# Patient Record
Sex: Male | Born: 1940 | Race: White | Marital: Married | Smoking: Never smoker
Health system: Northeastern US, Academic
[De-identification: ages and names within clinical notes are randomized; demographics above are authoritative.]

## PROBLEM LIST (undated history)

## (undated) DIAGNOSIS — M1990 Unspecified osteoarthritis, unspecified site: Secondary | ICD-10-CM

## (undated) DIAGNOSIS — E119 Type 2 diabetes mellitus without complications: Secondary | ICD-10-CM

## (undated) DIAGNOSIS — I10 Essential (primary) hypertension: Secondary | ICD-10-CM

## (undated) HISTORY — PX: HIP FRACTURE SURGERY: SHX118

## (undated) HISTORY — DX: Type 2 diabetes mellitus without complications: E11.9

## (undated) HISTORY — PX: HIP SURGERY: SHX245

## (undated) HISTORY — PX: HERNIA REPAIR: SHX51

## (undated) HISTORY — DX: Unspecified osteoarthritis, unspecified site: M19.90

## (undated) HISTORY — DX: Essential (primary) hypertension: I10

## (undated) HISTORY — PX: CATARACT REMOVAL WITH IMPLANT: SHX586

---

## 2017-01-01 ENCOUNTER — Other Ambulatory Visit: Payer: Self-pay | Admitting: Orthopedic Surgery

## 2017-01-01 DIAGNOSIS — M25562 Pain in left knee: Secondary | ICD-10-CM

## 2017-01-05 ENCOUNTER — Ambulatory Visit: Payer: Medicare (Managed Care) | Admitting: Sports Medicine

## 2017-01-05 ENCOUNTER — Ambulatory Visit
Admission: RE | Admit: 2017-01-05 | Discharge: 2017-01-05 | Disposition: A | Discharge status: Home / Self Care | Payer: Medicare (Managed Care) | Source: Ambulatory Visit | Attending: Orthopedic Surgery | Admitting: Orthopedic Surgery

## 2017-01-05 ENCOUNTER — Encounter: Payer: Self-pay | Admitting: Sports Medicine

## 2017-01-05 VITALS — BP 151/72 | HR 77 | Ht 68.5 in | Wt 162.0 lb

## 2017-01-05 DIAGNOSIS — M25462 Effusion, left knee: Secondary | ICD-10-CM

## 2017-01-05 DIAGNOSIS — M1712 Unilateral primary osteoarthritis, left knee: Secondary | ICD-10-CM | POA: Insufficient documentation

## 2017-01-05 DIAGNOSIS — M25562 Pain in left knee: Secondary | ICD-10-CM | POA: Insufficient documentation

## 2017-01-05 DIAGNOSIS — M170 Bilateral primary osteoarthritis of knee: Secondary | ICD-10-CM | POA: Insufficient documentation

## 2017-01-05 NOTE — Progress Notes (Signed)
[phone]

## 2017-01-05 NOTE — Progress Notes (Signed)
[name], Adam TERRY MR #:  [id number]   CSN:  [id number] DOB:  Mar 08, 1941   DICTATED BY:  Lanae BoastMichael Cristen Murcia, MD DATE OF VISIT:  01/05/2017     A 76 year old who presents with ongoing left knee symptoms, worse since November.  Had some swelling.  Has radiographically confirmed osteoarthritis.  He wanted to discuss the option of viscosupplementation.  This was reviewed with him today.  My PA spent time going through the options for management and the treatment algorithm for knee osteoarthritis.  He is thin, has good strength.     His exam today shows no warmth, erythema.  No effusion.  Gentle motion is well tolerated.  Non-antalgic gait.  I spent time reviewing with him treatment options, including potential viscosupplementation which he would like to pursue.  A referral was made to the nonoperative primary care sports medicine team to have formal discussion, and whether he wants to move forward he will let them know.             ______________________________  Lanae BoastMichael Regenia Erck, MD    MM/MODL  DD:  01/05/2017 11:45:50  DT:  01/05/2017 16:18:03  Job #:  [id number]    cc:

## 2017-01-05 NOTE — Progress Notes (Signed)
Adam Duran:   Adam, Duran  MR #:  [id number]   CSN:  [id number] DOB:  [birth year]   DICTATED BY:  Raechel AcheMeghan Lissow, PA DATE OF VISIT:  01/05/2017     CHIEF COMPLAINT:  Left knee pain.    INTERVAL HISTORY:  Patient presents here today with atraumatic left knee pain in the medial compartment.  No mechanical symptoms.  He states that he started feeling some symptoms in November and then again, around Christmas time.  Toward the springtime, there was a little bit of swelling that has since resolved.  He has well-preserved motion.  He is playing golf.  He has tried Tylenol, which does help alleviate his pain.  He was told to stay away from anti-inflammatories, although he is not sure why.  He is a controlled diabetic with his last hemoglobin A1c of 6.8.     His past medical history, medications, past surgeries, allergies, social history, family history, review of systems are per the musculoskeletal questionnaire.    PHYSICAL EXAMINATION:  Shows a cooperative gentleman.  No warmth, erythema does have crepitation.  Well-preserved motion.  No catch with circumduction.  He is ligamentously stable.  Neurovascularly intact.    IMAGING:  X-rays were taken showing a medial compartment osteoarthritis.    DIAGNOSIS:  Left knee osteoarthritis.    PLAN:  Patient educated on the findings.  We talked about cortisone injection; however, he is not keen on this as it could affect his sugar levels.  Instead he would like to go forth with viscosupplementation.  He was educated that this is not offered by Dr. Santiago BurMaloney's office; however, he will be referred to our nonoperative group for this.  He understands he will need an appointment for authorization with subsequent appointment for injections he does understand.  Questions otherwise invited and answered.       Dictated By:  Raechel AcheMeghan Lissow, PA      ______________________________  Lanae BoastMichael Maloney, MD    ML/MODL  DD:  01/05/2017 11:43:39  DT:  01/05/2017 12:47:43  Job #:   [id number]    cc:

## 2017-01-09 ENCOUNTER — Encounter: Payer: Self-pay | Admitting: Physical Medicine and Rehabilitation

## 2017-01-09 ENCOUNTER — Ambulatory Visit: Payer: Medicare (Managed Care) | Attending: Orthopedic Surgery | Admitting: Physical Medicine and Rehabilitation

## 2017-01-09 VITALS — BP 129/69 | Ht 68.5 in | Wt 162.0 lb

## 2017-01-09 DIAGNOSIS — M1712 Unilateral primary osteoarthritis, left knee: Secondary | ICD-10-CM

## 2017-01-09 MED ORDER — LIDOCAINE HCL 1 % IJ SOLN *I*
2.0000 mL | Freq: Once | INTRAMUSCULAR | Status: AC | PRN
Start: 2017-01-09 — End: 2017-01-09
  Administered 2017-01-09: 15:00:00 2 mL via INTRA_ARTICULAR

## 2017-01-09 MED ORDER — LIDOCAINE HCL 1 % IJ SOLN *I*
1.0000 mL | Freq: Once | INTRAMUSCULAR | Status: AC | PRN
Start: 2017-01-09 — End: 2017-01-09
  Administered 2017-01-09: 15:00:00 1 mL via INTRA_ARTICULAR

## 2017-01-09 MED ORDER — METHYLPREDNISOLONE ACETATE 40 MG/ML IJ SUSP *I*
80.0000 mg | Freq: Once | INTRAMUSCULAR | Status: AC | PRN
Start: 2017-01-09 — End: 2017-01-09
  Administered 2017-01-09: 15:00:00 80 mg via INTRA_ARTICULAR

## 2017-01-09 MED ORDER — ETHYL CHLORIDE EX AERO (MULTIPLE PATIENTS) *I*
1.0000 | INHALATION_SPRAY | Freq: Once | CUTANEOUS | Status: AC | PRN
Start: 2017-01-09 — End: 2017-01-09
  Administered 2017-01-09: 1 via TOPICAL

## 2017-01-09 NOTE — Patient Instructions (Signed)
If corticosteroid injection not helpful in 2 weeks, call my secretary:    Call Marylene LandAngela [phone] to schedule any of the following injections.  You can also call your insurance and provide them the following codes to determine your cost.    Prior authorization, billing and scheduling can take up to 2-4 weeks.      Monovisc x1  [phone]([id number] + [phone] Ultrasound Fee)   *Need to go thru Pre-Verification Billing Department first*- Cannot be given same day        Orthovisc x3  [phone]([phone])  Some insurances require prior authorization so cannot be given same day, other insurances do allow same day injection

## 2017-01-09 NOTE — Progress Notes (Signed)
Chief complaint:  Knee Pain    History of Present Illness:  This is a 76 y.o. male who presents with left knee pain.  Pain started around November/December 2017.  No injuries.  The patient was referred by Dr Elease Hashimoto.    The pain is located at the anterior medial knee.  Pain is made worse with walking and stairs.    Reports minimal swelling now.  Did have more swelling back in February.  No locking.  No instability.  Does use a brace when golfing.  Reports some occasional popping/clicking.    Plays golf and uses caution to avoid painful triggers.    Specific functional goals of viscosupplementation:  -walk further distances on unsteady ground  -be able to do stairs easier     Treatment History:    Physical therapy:   Yes []  No [x]  Helpful []  Somewhat Helpful []  Not helpful []     Exercise: golf, yard work, walking  Yes [x]  No []  Helpful []  Somewhat Helpful [x]  Not helpful []     Activity modification:   Yes [x]  No []  Helpful []  Somewhat Helpful [x]  Not helpful []     Acetaminophen:   Yes [x]  No []  Helpful []  Somewhat Helpful [x]  Not helpful []     NSAIDs: unable to do concern about side effects at his age   Yes []  No [x]  Helpful []  Somewhat Helpful []  Not helpful []     Corticosteroid injections:   Yes []  No [x]  Helpful []  Somewhat Helpful []  Not helpful []     Viscosupplementation:   Yes []  No [x]  Helpful []  Somewhat Helpful []  Not helpful []     Surgery:   Yes []  No [x]  Helpful []  Somewhat Helpful []  Not helpful []     Past Medical History:  Past Medical History:   Diagnosis Date    Arthritis     Diabetes mellitus, type 2     Hypertension        Past Surgical History:  Past Surgical History:   Procedure Laterality Date    CATARACT REMOVAL WITH IMPLANT      HERNIA REPAIR      HIP FRACTURE SURGERY      HIP SURGERY         Social History:  Social History     Social History    Marital status: Married     Spouse name: N/A    Number of children: N/A    Years of education: N/A     Social History Main Topics    Smoking  status: Never Smoker    Smokeless tobacco: Never Used    Alcohol use None    Drug use: None    Sexual activity: Not Asked     Other Topics Concern    None     Social History Narrative       Family History:  History reviewed. No pertinent family history.    Review of Systems:  A complete 12 point review is negative except for the following:  Knee pain   Kidney stones    Current Medications:  Current Outpatient Prescriptions on File Prior to Visit   Medication Sig Dispense Refill    METFORMIN HCL PO Take by mouth      atorvastatin (LIPITOR) 10 MG tablet Take 10 mg by mouth daily      lisinopril (PRINIVIL,ZESTRIL) 40 MG tablet Take 40 mg by mouth daily      verapamil (VERELAN) 360 MG 24 hr capsule Take 360 mg by mouth nightly  hydrochlorothiazide (MICROZIDE) 12.5 MG capsule Take 12.5 mg by mouth every morning       No current facility-administered medications on file prior to visit.        Allergies:  No Known Allergies (drug, envir, food or latex)    Physical Exam:  Pain    01/09/17 1408   PainSc:   4   PainLoc: Knee     Blood pressure 129/69, height 1.74 m (5' 8.5"), weight 73.5 kg (162 lb).  Body mass index is 24.27 kg/(m^2).  Taken by patient care tech    General: NAD , pleasant and cooperative  HEENT: MMM, no scleral icterus  CVS: good perfusion   Pulm: normal work of breathing   Skin: no rashes   Pysch: normal affect    MSK/neuro:   Knees appear symmetrical without any lesions, erythema or deformity.  No warmth.   +small left sided effusion.   Left knee:  + crepitus with passive range of motion.  no pain to palpation around borders of patella. + medial joint line tenderness.  no lateral joint line tenderness.  no quad tendon tenderness.  no patellar tendon tenderness.  Range of motion 0-115. Neurovascularly intact.   No ligamentous laxity with negative lachmans test, anterior drawer, posterior drawer.  Stable to valgus and varus stress. No pain with circumduction maneuvers.  Full strength in  bilateral lower extremities.  Gait steady and independent.    Assessment:  This is a 76 y.o. male who presents with left knee pain.  -Advanced left knee osteoarthritis worst at medial compartment    Plan:  Imaging reviewed.    I discussed the following treatment options:   Activity modifications discussed and recommended.   Discussed weight loss or maintenance of proper weight through diet and exercise.  Ice/heat to knee.  Medication options discussed and recommended - encouraged tylenol and NSAIDs prn as able.  Discussed injections including corticosteroid injections, orthovisc and monovisc injections.    Performed corticosteroid injection to the left knee.  See procedure note  Continue with existing conservative treatment program.   Questions solicited and answered.   Patient requested to call my office for prior authorization of orthovisc or monovisc if corticosteroid injection not helpful.    Thank you for allowing us to take part in this patient's care and contact us if you have any questions.    Terance HartJENNIFER Machel Violante, MD

## 2017-01-09 NOTE — Procedures (Signed)
Large Joint Aspiration/Injection Procedure    Date/Time: 01/09/2017 2:42 PM  Consent given by: patient  Site marked: site marked  Timeout: Immediately prior to procedure a time out was called to verify the correct patient, procedure, equipment, support staff and site/side marked as required     Procedure Details    Location: knee - L knee intra - articular  Preparation: The site was prepped using the usual aseptic technique.  Anesthetics administered: 2 mL lidocaine 1 %; 1 mL lidocaine 1 %; 1 spray ethyl chloride  Intra-Articular Steroids administered: 80 mg methylPREDNISolone acetate 40 MG/ML  Dressing:  A dry, sterile dressing was applied.  Patient tolerance: patient tolerated the procedure well with no immediate complications

## 2017-05-22 LAB — UNMAPPED LAB RESULTS
Hematocrit (HT): 44.3 % (ref 40.0–52.0)
Hemoglobin (HGB) (HT): 14.6 g/dL (ref 13.0–17.5)
MCHC (HT): 33 g/dL (ref 32.0–36.0)
MCV (HT): 87.2 FL (ref 81.0–99.0)
Mean Corpuscular Hemoglobin (MCH) (HT): 28.7 pg (ref 26.0–34.0)
Platelets (HT): 221 10 3/uL (ref 140–400)
RBC (HT): 5.08 10 6/uL (ref 4.20–5.90)
RDW (HT): 12.2 % (ref 11.5–15.0)
WBC (HT): 4.8 10 3/uL (ref 4.0–10.8)

## 2017-05-23 ENCOUNTER — Ambulatory Visit
Payer: Medicare (Managed Care) | Attending: Physical Medicine and Rehabilitation | Admitting: Physical Medicine and Rehabilitation

## 2017-05-23 ENCOUNTER — Encounter: Payer: Self-pay | Admitting: Physical Medicine and Rehabilitation

## 2017-05-23 VITALS — BP 136/71 | HR 77 | Ht 68.5 in | Wt 162.0 lb

## 2017-05-23 DIAGNOSIS — M170 Bilateral primary osteoarthritis of knee: Secondary | ICD-10-CM

## 2017-05-23 MED ORDER — METHYLPREDNISOLONE ACETATE 40 MG/ML IJ SUSP *I*
80.0000 mg | Freq: Once | INTRAMUSCULAR | Status: AC | PRN
Start: 2017-05-23 — End: 2017-05-23
  Administered 2017-05-23: 80 mg via INTRA_ARTICULAR

## 2017-05-23 MED ORDER — LIDOCAINE HCL 1 % IJ SOLN *I*
1.0000 mL | Freq: Once | INTRAMUSCULAR | Status: AC | PRN
Start: 2017-05-23 — End: 2017-05-23
  Administered 2017-05-23: 1 mL via INTRA_ARTICULAR

## 2017-05-23 MED ORDER — LIDOCAINE HCL 1 % IJ SOLN *I*
2.0000 mL | Freq: Once | INTRAMUSCULAR | Status: AC | PRN
Start: 2017-05-23 — End: 2017-05-23
  Administered 2017-05-23: 13:00:00 2 mL via INTRA_ARTICULAR

## 2017-05-23 MED ORDER — ETHYL CHLORIDE EX AERO (MULTIPLE PATIENTS) *I*
1.0000 | INHALATION_SPRAY | Freq: Once | CUTANEOUS | Status: AC | PRN
Start: 2017-05-23 — End: 2017-05-23
  Administered 2017-05-23: 1 via TOPICAL

## 2017-05-23 NOTE — Progress Notes (Signed)
Chief complaint:  Knee Pain    History of Present Illness:  This is a 76 y.o. male who presents with bilateral knee pain.  Pain started around November/December 2017.  No injuries.  The patient was referred by Dr Elease Hashimoto.    Here today for follow-up of left knee pain but also now for a new problem: right knee pain.    Reports he had right knee pain back in November 2017 when left knee pain started but was not as significant until recently.  The pain is located at the medial anterior knees bilaterally.  No swelling.  No locking.  No instability.  Worse with walking/golf.    Reports excellent benefit after corticosteroid injection done in the spring.  Pain just returned in the left knee.     Heading to [place] for the winter in 2 weeks. Returns in April.       Treatment History:    Physical therapy:   Yes  No  Helpful  Somewhat Helpful  Not helpful     Exercise: golf, yard work, walking  Yes  No  Helpful  Somewhat Helpful  Not helpful     Activity modification:   Yes  No  Helpful  Somewhat Helpful  Not helpful     Acetaminophen:   Yes  No  Helpful  Somewhat Helpful  Not helpful     NSAIDs: unable to do concern about side effects at his age   Yes  No  Helpful  Somewhat Helpful  Not helpful     Corticosteroid injections:  Left knee  Yes  No  Helpful  Somewhat Helpful  Not helpful     Viscosupplementation:   Yes  No  Helpful  Somewhat Helpful  Not helpful     Surgery:   Yes  No  Helpful  Somewhat Helpful  Not helpful     Past Medical History:  Past Medical History:   Diagnosis Date    Arthritis     Diabetes mellitus, type 2     Hypertension        Past Surgical History:  Past Surgical History:   Procedure Laterality Date    CATARACT REMOVAL WITH IMPLANT      HERNIA REPAIR      HIP FRACTURE SURGERY      HIP SURGERY         Social History:  Social History     Social History    Marital status: Married     Spouse name: N/A     Number of children: N/A    Years of education: N/A     Social History Main Topics    Smoking status: Never Smoker    Smokeless tobacco: Never Used    Alcohol use None    Drug use: None    Sexual activity: Not Asked     Other Topics Concern    None     Social History Narrative       Family History:  History reviewed. No pertinent family history.    Review of Systems:  Knee pain       Current Medications:  Current Outpatient Prescriptions on File Prior to Visit   Medication Sig Dispense Refill    METFORMIN HCL PO Take by mouth      atorvastatin (LIPITOR) 10 MG tablet Take 10 mg by mouth daily      lisinopril (PRINIVIL,ZESTRIL) 40 MG tablet Take 40 mg by mouth daily      verapamil (VERELAN) 360  MG 24 hr capsule Take 360 mg by mouth nightly      hydrochlorothiazide (MICROZIDE) 12.5 MG capsule Take 12.5 mg by mouth every morning       No current facility-administered medications on file prior to visit.        Allergies:  No Known Allergies (drug, envir, food or latex)    Physical Exam:  Pain    05/23/17 1244   PainSc:   3   PainLoc: Knee     Blood pressure 136/71, pulse 77, height 1.74 m (5' 8.5"), weight 73.5 kg (162 lb).  Body mass index is 24.27 kg/(m^2).  Taken by patient care tech    General: NAD , pleasant and cooperative  HEENT: MMM, no scleral icterus  CVS: good perfusion   Pulm: normal work of breathing   Skin: no rashes   Pysch: normal affect    MSK/neuro:   Knees appear symmetrical without any lesions, erythema or deformity.  No warmth.   No effusion.   Right knee:  + crepitus with passive range of motion.  no pain to palpation around borders of patella. no medial joint line tenderness.  no lateral joint line tenderness.  no quad tendon tenderness.  no patellar tendon tenderness.  Range of motion 5-120. Neurovascularly intact.   No ligamentous laxity with negative lachmans test, anterior drawer, posterior drawer.  Stable to valgus and varus stress. No pain with circumduction maneuvers.  Gait steady  and independent.    Assessment:  This is a 76 y.o. male who presents with bilateral knee pain.  -Moderate bilateral knee osteoarthritis worst at medial compartments    Plan:  Imaging reviewed.    I discussed the following treatment options:   Activity modifications discussed and recommended.   Discussed weight loss or maintenance of proper weight through diet and exercise.  Ice/heat to knee.  Discussed injections including corticosteroid injections.  Performed corticosteroid injection to the bilateral knees.  See procedure note  Patient requested to follow-up prn    Thank you for allowing [place] to take part in this patient's care and contact us if you have any questions.    Terance Hart, MD

## 2017-05-23 NOTE — Procedures (Signed)
Large Joint Aspiration/Injection Procedure    Date/Time: 05/23/2017 1:01 PM  Consent given by: patient  Site marked: site marked  Timeout: Immediately prior to procedure a time out was called to verify the correct patient, procedure, equipment, support staff and site/side marked as required     Procedure Details    Location: knee - R knee intra - articular  Preparation: The site was prepped using the usual aseptic technique.  Anesthetics administered: 1 spray ethyl chloride; 2 mL lidocaine hcl 1 %; 1 mL lidocaine hcl 1 %  Intra-Articular Steroids administered: 80 mg methylPREDNISolone acetate 40 MG/ML  Dressing:  A dry, sterile dressing was applied.  Patient tolerance: patient tolerated the procedure well with no immediate complications

## 2017-05-23 NOTE — Procedures (Signed)
Large Joint Aspiration/Injection Procedure    Date/Time: 05/23/2017 1:02 PM  Consent given by: patient  Site marked: site marked  Timeout: Immediately prior to procedure a time out was called to verify the correct patient, procedure, equipment, support staff and site/side marked as required     Procedure Details    Location: knee - L knee intra - articular  Preparation: The site was prepped using the usual aseptic technique.  Anesthetics administered: 1 spray ethyl chloride; 2 mL lidocaine hcl 1 %; 1 mL lidocaine hcl 1 %  Intra-Articular Steroids administered: 80 mg methylPREDNISolone acetate 40 MG/ML  Dressing:  A dry, sterile dressing was applied.  Patient tolerance: patient tolerated the procedure well with no immediate complications

## 2017-12-18 ENCOUNTER — Ambulatory Visit
Payer: Medicare (Managed Care) | Attending: Physical Medicine and Rehabilitation | Admitting: Physical Medicine and Rehabilitation

## 2017-12-18 ENCOUNTER — Encounter: Payer: Self-pay | Admitting: Physical Medicine and Rehabilitation

## 2017-12-18 VITALS — BP 153/71 | HR 65 | Ht 68.5 in | Wt 162.0 lb

## 2017-12-18 DIAGNOSIS — M170 Bilateral primary osteoarthritis of knee: Secondary | ICD-10-CM

## 2017-12-18 MED ORDER — ETHYL CHLORIDE EX AERO (MULTIPLE PATIENTS) *I*
1.0000 | INHALATION_SPRAY | Freq: Once | CUTANEOUS | Status: AC | PRN
Start: 2017-12-18 — End: 2017-12-18
  Administered 2017-12-18: 1 via TOPICAL

## 2017-12-18 MED ORDER — LIDOCAINE HCL 1 % IJ SOLN *I*
2.0000 mL | Freq: Once | INTRAMUSCULAR | Status: AC | PRN
Start: 2017-12-18 — End: 2017-12-18
  Administered 2017-12-18: 2 mL via INTRA_ARTICULAR

## 2017-12-18 MED ORDER — LIDOCAINE HCL 1 % IJ SOLN *I*
1.0000 mL | Freq: Once | INTRAMUSCULAR | Status: AC | PRN
Start: 2017-12-18 — End: 2017-12-18
  Administered 2017-12-18: 1 mL via INTRA_ARTICULAR

## 2017-12-18 MED ORDER — METHYLPREDNISOLONE ACETATE 40 MG/ML IJ SUSP *I*
80.0000 mg | Freq: Once | INTRAMUSCULAR | Status: AC | PRN
Start: 2017-12-18 — End: 2017-12-18
  Administered 2017-12-18: 80 mg via INTRA_ARTICULAR

## 2017-12-18 NOTE — Procedures (Signed)
Large Joint Aspiration/Injection Procedure    Date/Time: 12/18/2017 1:03 PM  Consent given by: patient  Site marked: site marked  Timeout: Immediately prior to procedure a time out was called to verify the correct patient, procedure, equipment, support staff and site/side marked as required     Procedure Details    Location: knee - L knee intra - articular  Preparation: The site was prepped using the usual aseptic technique.  Anesthetics administered: 1 spray ethyl chloride; 2 mL lidocaine hcl 1 %; 1 mL lidocaine hcl 1 %  Intra-Articular Steroids administered: 80 mg methylPREDNISolone acetate 40 MG/ML  Dressing:  A dry, sterile dressing was applied.  Patient tolerance: patient tolerated the procedure well with no immediate complications

## 2017-12-18 NOTE — Procedures (Signed)
Large Joint Aspiration/Injection Procedure    Date/Time: 12/18/2017 1:04 PM  Consent given by: patient  Site marked: site marked  Timeout: Immediately prior to procedure a time out was called to verify the correct patient, procedure, equipment, support staff and site/side marked as required     Procedure Details    Location: knee - R knee intra - articular  Preparation: The site was prepped using the usual aseptic technique.  Anesthetics administered: 1 spray ethyl chloride; 2 mL lidocaine hcl 1 %; 1 mL lidocaine hcl 1 %  Intra-Articular Steroids administered: 80 mg methylPREDNISolone acetate 40 MG/ML  Dressing:  A dry, sterile dressing was applied.  Patient tolerance: patient tolerated the procedure well with no immediate complications

## 2018-05-20 LAB — UNMAPPED LAB RESULTS
ABO RH Blood Type (HT): A POS
Antibody Screen (HT): NEGATIVE
Hemoglobin (HGB) (HT): 14.3 g/dL (ref 13.0–17.5)

## 2018-05-28 ENCOUNTER — Ambulatory Visit
Payer: Medicare (Managed Care) | Attending: Physical Medicine and Rehabilitation | Admitting: Physical Medicine and Rehabilitation

## 2018-05-28 ENCOUNTER — Encounter: Payer: Self-pay | Admitting: Physical Medicine and Rehabilitation

## 2018-05-28 VITALS — BP 122/68 | Ht 68.5 in | Wt 162.0 lb

## 2018-05-28 DIAGNOSIS — M170 Bilateral primary osteoarthritis of knee: Secondary | ICD-10-CM

## 2018-05-28 MED ORDER — LIDOCAINE HCL 1 % IJ SOLN *I*
1.0000 mL | Freq: Once | INTRAMUSCULAR | Status: AC | PRN
Start: 2018-05-28 — End: 2018-05-28
  Administered 2018-05-28: 1 mL via INTRA_ARTICULAR

## 2018-05-28 MED ORDER — LIDOCAINE HCL 1 % IJ SOLN *I*
2.0000 mL | Freq: Once | INTRAMUSCULAR | Status: AC | PRN
Start: 2018-05-28 — End: 2018-05-28
  Administered 2018-05-28: 2 mL via INTRA_ARTICULAR

## 2018-05-28 MED ORDER — METHYLPREDNISOLONE ACETATE 40 MG/ML IJ SUSP *I*
80.0000 mg | Freq: Once | INTRAMUSCULAR | Status: AC | PRN
Start: 2018-05-28 — End: 2018-05-28
  Administered 2018-05-28: 80 mg via INTRA_ARTICULAR

## 2018-05-28 MED ORDER — ETHYL CHLORIDE EX AERO (MULTIPLE PATIENTS) *I*
1.0000 | INHALATION_SPRAY | Freq: Once | CUTANEOUS | Status: AC | PRN
Start: 2018-05-28 — End: 2018-05-28
  Administered 2018-05-28: 1 via TOPICAL

## 2018-05-28 NOTE — Procedures (Signed)
Large Joint Aspiration/Injection Procedure: R knee intra - articular    Date/Time: 05/28/2018 2:34 PM  Consent given by: patient  Site marked: site marked  Timeout: Immediately prior to procedure a time out was called to verify the correct patient, procedure, equipment, support staff and site/side marked as required     Procedure Details    Location: knee - R knee intra - articular  Preparation: The site was prepped using the usual aseptic technique.  Anesthetics administered: 1 spray ethyl chloride; 2 mL lidocaine hcl 1 %; 1 mL lidocaine hcl 1 %  Intra-Articular Steroids administered: 80 mg methylPREDNISolone acetate 40 MG/ML  Dressing:  A dry, sterile dressing was applied.  Patient tolerance: patient tolerated the procedure well with no immediate complications      Here today with spouse who I am also seeing as a patient.  Leaving for [place] for the winter in December after his THA next month.  Does have diabetes- reports most recent HgbA1c <8.  Aware of risks of elevated sugars.

## 2018-05-28 NOTE — Procedures (Signed)
Large Joint Aspiration/Injection Procedure: L knee intra - articular    Date/Time: 05/28/2018 2:33 PM  Consent given by: patient  Site marked: site marked  Timeout: Immediately prior to procedure a time out was called to verify the correct patient, procedure, equipment, support staff and site/side marked as required     Procedure Details    Location: knee - L knee intra - articular  Preparation: The site was prepped using the usual aseptic technique.  Anesthetics administered: 1 spray ethyl chloride; 2 mL lidocaine hcl 1 %; 1 mL lidocaine hcl 1 %  Intra-Articular Steroids administered: 80 mg methylPREDNISolone acetate 40 MG/ML  Dressing:  A dry, sterile dressing was applied.  Patient tolerance: patient tolerated the procedure well with no immediate complications      Here today with spouse who I am also seeing as a patient.  Leaving for [place] for the winter in December after his THA next month.  Does have diabetes- reports most recent HgbA1c <8.  Aware of risks of elevated sugars.

## 2018-06-15 LAB — UNMAPPED LAB RESULTS
Hematocrit (HT): 31 % — ABNORMAL LOW (ref 40–52)
Hemoglobin (HGB) (HT): 9.9 g/dL — ABNORMAL LOW (ref 13.0–17.5)
MCHC (HT): 32.4 g/dL (ref 32.0–36.0)
MCV (HT): 89 fL (ref 81–99)
Mean Corpuscular Hemoglobin (MCH) (HT): 28.9 pg (ref 26.0–34.0)
Platelets (HT): 153 10 3/uL (ref 140–400)
RBC (HT): 3.43 10 6/uL — ABNORMAL LOW (ref 4.60–6.20)
RDW (HT): 12.8 % (ref 11.5–15.0)
WBC (HT): 8.9 10 3/uL (ref 4.0–10.0)

## 2018-12-31 LAB — UNMAPPED LAB RESULTS
Hematocrit (HT): 42.7 % (ref 40.0–52.0)
Hemoglobin (HGB) (HT): 14.2 g/dL (ref 13.0–17.5)
MCHC (HT): 33.3 g/dL (ref 32.0–36.0)
MCV (HT): 86.8 FL (ref 81.0–99.0)
Mean Corpuscular Hemoglobin (MCH) (HT): 28.9 pg (ref 26.0–34.0)
Platelets (HT): 221 10 3/uL (ref 140–400)
RBC (HT): 4.92 10 6/uL (ref 4.20–5.90)
RDW (HT): 12.9 % (ref 11.5–15.0)
WBC (HT): 5.6 10 3/uL (ref 4.0–10.8)

## 2019-01-08 ENCOUNTER — Ambulatory Visit
Payer: Medicare (Managed Care) | Attending: Physical Medicine and Rehabilitation | Admitting: Physical Medicine and Rehabilitation

## 2019-01-08 ENCOUNTER — Encounter: Payer: Self-pay | Admitting: Physical Medicine and Rehabilitation

## 2019-01-08 VITALS — BP 146/68 | HR 75 | Temp 97.2°F

## 2019-01-08 DIAGNOSIS — M170 Bilateral primary osteoarthritis of knee: Secondary | ICD-10-CM | POA: Insufficient documentation

## 2019-01-08 MED ORDER — METHYLPREDNISOLONE ACETATE 40 MG/ML IJ SUSP *I*
80.0000 mg | Freq: Once | INTRAMUSCULAR | Status: AC | PRN
Start: 2019-01-08 — End: 2019-01-08
  Administered 2019-01-08: 80 mg via INTRA_ARTICULAR

## 2019-01-08 MED ORDER — LIDOCAINE HCL 1 % IJ SOLN *I*
1.0000 mL | Freq: Once | INTRAMUSCULAR | Status: AC | PRN
Start: 2019-01-08 — End: 2019-01-08
  Administered 2019-01-08: 1 mL via INTRA_ARTICULAR

## 2019-01-08 MED ORDER — LIDOCAINE HCL 1 % IJ SOLN *I*
0.5000 mL | Freq: Once | INTRAMUSCULAR | Status: AC | PRN
Start: 2019-01-08 — End: 2019-01-08
  Administered 2019-01-08: .5 mL via INTRA_ARTICULAR

## 2019-01-08 MED ORDER — LIDOCAINE HCL 1 % IJ SOLN *I*
1.5000 mL | Freq: Once | INTRAMUSCULAR | Status: AC | PRN
Start: 2019-01-08 — End: 2019-01-08
  Administered 2019-01-08: 1.5 mL via INTRA_ARTICULAR

## 2019-01-08 MED ORDER — ETHYL CHLORIDE EX AERO (MULTIPLE PATIENTS) *I*
1.0000 | INHALATION_SPRAY | Freq: Once | CUTANEOUS | Status: AC | PRN
Start: 2019-01-08 — End: 2019-01-08
  Administered 2019-01-08: 1 via TOPICAL

## 2019-01-08 NOTE — Procedures (Signed)
Large Joint Aspiration/Injection Procedure: R knee intra - articular    Date/Time: 01/08/2019  2:40 PM EDT  Consent given by: patient  Site marked: site marked  Timeout: Immediately prior to procedure a time out was called to verify the correct patient, procedure, equipment, support staff and site/side marked as required     Procedure Details    Location: knee - R knee intra - articular  Preparation: The site was prepped using the usual aseptic technique.  Anesthetics administered: 1 spray ethyl chloride; 1 mL lidocaine hcl 1 %; 1.5 mL lidocaine hcl 1 %; 0.5 mL lidocaine hcl 1 %  Intra-Articular Steroids administered: 80 mg methylPREDNISolone acetate 40 MG/ML  Dressing:  A dry, sterile dressing was applied.  Patient tolerance: patient tolerated the procedure well with no immediate complications      Most recent A1c<8

## 2019-01-08 NOTE — Procedures (Signed)
Large Joint Aspiration/Injection Procedure: L knee intra - articular    Date/Time: 01/08/2019  2:40 PM EDT  Consent given by: patient  Site marked: site marked  Timeout: Immediately prior to procedure a time out was called to verify the correct patient, procedure, equipment, support staff and site/side marked as required     Procedure Details    Location: knee - L knee intra - articular  Preparation: The site was prepped using the usual aseptic technique.  Anesthetics administered: 1 spray ethyl chloride; 1 mL lidocaine hcl 1 %; 1.5 mL lidocaine hcl 1 %; 0.5 mL lidocaine hcl 1 %  Intra-Articular Steroids administered: 80 mg methylPREDNISolone acetate 40 MG/ML  Dressing:  A dry, sterile dressing was applied.  Patient tolerance: patient tolerated the procedure well with no immediate complications      Most recent A1c<8

## 2019-02-03 ENCOUNTER — Encounter: Payer: Self-pay | Admitting: Physical Medicine and Rehabilitation

## 2019-02-12 ENCOUNTER — Encounter: Payer: Self-pay | Admitting: Physical Medicine and Rehabilitation

## 2019-05-20 ENCOUNTER — Ambulatory Visit
Payer: Medicare (Managed Care) | Attending: Physical Medicine and Rehabilitation | Admitting: Physical Medicine and Rehabilitation

## 2019-05-20 ENCOUNTER — Encounter: Payer: Self-pay | Admitting: Physical Medicine and Rehabilitation

## 2019-05-20 VITALS — BP 145/71 | HR 75 | Temp 97.0°F

## 2019-05-20 DIAGNOSIS — M170 Bilateral primary osteoarthritis of knee: Secondary | ICD-10-CM | POA: Insufficient documentation

## 2019-05-20 MED ORDER — LIDOCAINE HCL 1 % IJ SOLN *I*
0.5000 mL | Freq: Once | INTRAMUSCULAR | Status: AC | PRN
Start: 2019-05-20 — End: 2019-05-20
  Administered 2019-05-20: .5 mL via INTRA_ARTICULAR

## 2019-05-20 MED ORDER — ETHYL CHLORIDE EX AERO (MULTIPLE PATIENTS) *I*
1.0000 | INHALATION_SPRAY | Freq: Once | CUTANEOUS | Status: AC | PRN
Start: 2019-05-20 — End: 2019-05-20
  Administered 2019-05-20: 1 via TOPICAL

## 2019-05-20 MED ORDER — LIDOCAINE HCL 1 % IJ SOLN *I*
1.5000 mL | Freq: Once | INTRAMUSCULAR | Status: AC | PRN
Start: 2019-05-20 — End: 2019-05-20
  Administered 2019-05-20: 1.5 mL via INTRA_ARTICULAR

## 2019-05-20 MED ORDER — LIDOCAINE HCL 1 % IJ SOLN *I*
1.0000 mL | Freq: Once | INTRAMUSCULAR | Status: AC | PRN
Start: 2019-05-20 — End: 2019-05-20
  Administered 2019-05-20: 1 mL via INTRA_ARTICULAR

## 2019-05-20 MED ORDER — METHYLPREDNISOLONE ACETATE 40 MG/ML IJ SUSP *I*
80.0000 mg | Freq: Once | INTRAMUSCULAR | Status: AC | PRN
Start: 2019-05-20 — End: 2019-05-20
  Administered 2019-05-20: 80 mg via INTRA_ARTICULAR

## 2019-05-20 NOTE — Patient Instructions (Signed)
PM&R Post Injection/Procedure Instructions    You received a steroid/cortisone injection today.  This injection has numbing medicine (Lidocaine) and steroid medication.  The numbing medicine may start to work right away and give you immediate pain relief.  However, this will wear off in several hours.  It can take 5-7 days and sometimes up to two weeks for the steroid medication to take effect.  Therefore, please give the injection at least a week to start to notice a benefit.     Notify the office [phone] if you experience any of the following after today’s procedure:  • Fever of 100 degrees Fahrenheit or 38 degrees Celsius or greater  • Excessive swelling or redness at the injection site  • Excessive pain or numbness that lasts longer than 72 hours after your procedure.  • Seek urgent medical care if staff is unavailable.   Care of the injection site:  • You may ice the injection site for local discomfort.  Ice should be covered with a cloth - Never place directly on skin.  • Apply ice for 15 minutes on then off for 1 hour.  Repeat as needed.  • Take band aid off any time.  • Do not submerge the area in water for 24 hours - showering is OK.  • Avoid strenuous activity for the first two days after injection.  Diabetic patients:  • Your blood sugar can be elevated for about a week due to the steroid used in the injection, so check your blood sugar regularly.  • Call your primary care physician if it goes above 300.  Common side effects of injections:  • Bruising and minor swelling at site  • Increased pain  • Numbness for the first 6-8 hours after injection - this should subside or return to baseline  • Sometimes the medication can cause skin pigment lightening or changes.

## 2019-05-20 NOTE — Procedures (Signed)
Large Joint Aspiration/Injection Procedure: R knee intra - articular    Date/Time: 05/20/2019  2:40 PM EDT  Consent given by: patient  Site marked: site marked  Timeout: Immediately prior to procedure a time out was called to verify the correct patient, procedure, equipment, support staff and site/side marked as required     Procedure Details    Location: knee - R knee intra - articular  Preparation: The site was prepped using the usual aseptic technique.  Anesthetics administered: 1 spray ethyl chloride; 1 mL lidocaine hcl 1 %; 1.5 mL lidocaine hcl 1 %; 0.5 mL lidocaine hcl 1 %  Intra-Articular Steroids administered: 80 mg methylPREDNISolone acetate 40 MG/ML  Dressing:  A dry, sterile dressing was applied.  Patient tolerance: patient tolerated the procedure well with no immediate complications      P5T 7.4  Leaving for [place] next week

## 2019-05-20 NOTE — Procedures (Signed)
Large Joint Aspiration/Injection Procedure: L knee intra - articular    Date/Time: 05/20/2019  2:40 PM EDT  Consent given by: patient  Site marked: site marked  Timeout: Immediately prior to procedure a time out was called to verify the correct patient, procedure, equipment, support staff and site/side marked as required     Procedure Details    Location: knee - L knee intra - articular  Preparation: The site was prepped using the usual aseptic technique.  Anesthetics administered: 1 spray ethyl chloride; 1 mL lidocaine hcl 1 %; 1.5 mL lidocaine hcl 1 %; 0.5 mL lidocaine hcl 1 %  Intra-Articular Steroids administered: 80 mg methylPREDNISolone acetate 40 MG/ML  Dressing:  A dry, sterile dressing was applied.  Patient tolerance: patient tolerated the procedure well with no immediate complications      G9Q 7.4  Leaving for [place] next week

## 2019-05-28 ENCOUNTER — Ambulatory Visit: Payer: Medicare (Managed Care) | Admitting: Physical Medicine and Rehabilitation

## 2019-06-04 ENCOUNTER — Ambulatory Visit: Payer: Medicare (Managed Care) | Admitting: Physical Medicine and Rehabilitation

## 2019-07-15 ENCOUNTER — Encounter: Payer: Self-pay | Admitting: Physical Medicine and Rehabilitation

## 2019-11-21 ENCOUNTER — Other Ambulatory Visit: Payer: Self-pay | Admitting: Physical Medicine and Rehabilitation

## 2019-11-21 DIAGNOSIS — R52 Pain, unspecified: Secondary | ICD-10-CM

## 2020-01-01 ENCOUNTER — Ambulatory Visit
Admission: RE | Admit: 2020-01-01 | Discharge: 2020-01-01 | Disposition: A | Discharge status: Home / Self Care | Payer: Medicare (Managed Care) | Source: Ambulatory Visit

## 2020-01-01 DIAGNOSIS — M25462 Effusion, left knee: Secondary | ICD-10-CM

## 2020-01-01 DIAGNOSIS — M25461 Effusion, right knee: Secondary | ICD-10-CM

## 2020-01-01 DIAGNOSIS — R52 Pain, unspecified: Secondary | ICD-10-CM

## 2020-01-06 ENCOUNTER — Ambulatory Visit
Payer: Medicare (Managed Care) | Attending: Physical Medicine and Rehabilitation | Admitting: Physical Medicine and Rehabilitation

## 2020-01-06 ENCOUNTER — Encounter: Payer: Self-pay | Admitting: Physical Medicine and Rehabilitation

## 2020-01-06 VITALS — BP 136/65 | HR 71 | Temp 97.5°F | Ht 68.5 in | Wt 162.0 lb

## 2020-01-06 DIAGNOSIS — M25561 Pain in right knee: Secondary | ICD-10-CM | POA: Insufficient documentation

## 2020-01-06 DIAGNOSIS — M170 Bilateral primary osteoarthritis of knee: Secondary | ICD-10-CM

## 2020-01-06 DIAGNOSIS — M25562 Pain in left knee: Secondary | ICD-10-CM | POA: Insufficient documentation

## 2020-01-06 MED ORDER — METHYLPREDNISOLONE ACETATE 40 MG/ML IJ SUSP *I*
40.0000 mg | Freq: Once | INTRAMUSCULAR | Status: AC | PRN
Start: 2020-01-06 — End: 2020-01-06
  Administered 2020-01-06: 40 mg via INTRA_ARTICULAR

## 2020-01-06 MED ORDER — LIDOCAINE HCL 1 % IJ SOLN *I*
1.0000 mL | Freq: Once | INTRAMUSCULAR | Status: AC | PRN
Start: 2020-01-06 — End: 2020-01-06
  Administered 2020-01-06: 1 mL via INTRA_ARTICULAR

## 2020-01-06 MED ORDER — ETHYL CHLORIDE EX AERO (MULTIPLE PATIENTS) *I*
1.0000 | INHALATION_SPRAY | Freq: Once | CUTANEOUS | Status: AC | PRN
Start: 2020-01-06 — End: 2020-01-06
  Administered 2020-01-06: 1 via TOPICAL

## 2020-01-06 NOTE — Procedures (Signed)
Large Joint Aspiration/Injection Procedure: R knee intra - articular    Date/Time: 01/06/2020  1:20 PM EDT  Consent given by: patient  Site marked: site marked  Timeout: Immediately prior to procedure a time out was called to verify the correct patient, procedure, equipment, support staff and site/side marked as required     Procedure Details    Location: knee - R knee intra - articular  Preparation: The site was prepped using the usual aseptic technique.  Anesthetics administered: 1 spray ethyl chloride; 1 mL lidocaine hcl 1 %; 1 mL lidocaine hcl 1 %  Intra-Articular Steroids administered: 40 mg methylPREDNISolone acetate 40 MG/ML  Dressing:  A dry, sterile dressing was applied.  Patient tolerance: patient tolerated the procedure well with no immediate complications

## 2020-01-06 NOTE — Progress Notes (Signed)
Chief complaint:  Knee Pain follow-up    History of Present Illness:  This is a 79 y.o. male who presents with bilateral knee pain.  Pain started around November/December 2017.  No injuries.  The patient was referred by Dr Elease Hashimoto.    Here for follow-up of bilateral knees.  Just returned from [place].    Reports pain to be worse now especially at bilateral medial knees.    Last cortisone injections were helpful- lasting 6 months with 60-70% benefit.  Was able to walk longer distances.    Pain Frequency: Continuous  Pain Descriptors: Aching   Pain Score:   5  Pain Loc: Knee (Pt reports pain in B knees)       Treatment History:    Physical therapy:   Yes []  No [x]  Helpful []  Somewhat Helpful []  Not helpful []     Exercise: golf, yard work, walking  Yes [x]  No []  Helpful []  Somewhat Helpful [x]  Not helpful []     Activity modification:   Yes [x]  No []  Helpful []  Somewhat Helpful [x]  Not helpful []     Acetaminophen:   Yes [x]  No []  Helpful []  Somewhat Helpful [x]  Not helpful []     NSAIDs: unable to do concern about side effects at his age   Yes []  No [x]  Helpful []  Somewhat Helpful []  Not helpful []     Corticosteroid injections:    Yes [x]  No []  Helpful [x]  Somewhat Helpful []  Not helpful []     Viscosupplementation:   Yes []  No [x]  Helpful []  Somewhat Helpful []  Not helpful []     Surgery:   Yes []  No [x]  Helpful []  Somewhat Helpful []  Not helpful []       Physical Exam:  Pain    01/06/20 1256   PainSc:   5   PainLoc: Knee     Blood pressure 136/65, pulse 71, temperature 36.4 C (97.5 F), temperature source Temporal, height 1.74 m (5' 8.5"), weight 73.5 kg (162 lb).  Body mass index is 24.27 kg/m.  Taken by patient care tech      MSK/neuro:   Knees appear symmetrical without any lesions, erythema or deformity.  No warmth.   No effusion.   Right knee:  + crepitus with passive range of motion.  no pain to palpation around borders of patella. no medial joint line tenderness.  no lateral joint line tenderness.  no quad tendon  tenderness.  no patellar tendon tenderness.  Gait steady and independent.    Assessment:  This is a 79 y.o. male who presents with bilateral knee pain.  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  Recommended and performed corticosteroid injection to the bilateral knees.  See procedure note    Thank you for allowing to take part in this patient's care and contact if you have any questions.    , MD

## 2020-01-06 NOTE — Procedures (Signed)
Large Joint Aspiration/Injection Procedure: L knee intra - articular    Date/Time: 01/06/2020  1:20 PM EDT  Consent given by: patient  Site marked: site marked  Timeout: Immediately prior to procedure a time out was called to verify the correct patient, procedure, equipment, support staff and site/side marked as required     Procedure Details    Location: knee - L knee intra - articular  Preparation: The site was prepped using the usual aseptic technique.  Anesthetics administered: 1 spray ethyl chloride; 1 mL lidocaine hcl 1 %; 1 mL lidocaine hcl 1 %  Intra-Articular Steroids administered: 40 mg methylPREDNISolone acetate 40 MG/ML  Dressing:  A dry, sterile dressing was applied.  Patient tolerance: patient tolerated the procedure well with no immediate complications

## 2020-01-06 NOTE — Patient Instructions (Addendum)
PM&R Post Injection/Procedure Instructions    You received a steroid/cortisone injection today.  This injection has numbing medicine (Lidocaine) and steroid medication.  The numbing medicine may start to work right away and give you immediate pain relief.  However, this will wear off in several hours.  It can take 5-7 days and sometimes up to two weeks for the steroid medication to take effect.  Therefore, please give the injection at least a week to start to notice a benefit.     Notify the office [phone] if you experience any of the following after today’s procedure:  • Fever of 100 degrees Fahrenheit or 38 degrees Celsius or greater  • Excessive swelling or redness at the injection site  • Excessive pain or numbness that lasts longer than 72 hours after your procedure.  • Seek urgent medical care if staff is unavailable.   Care of the injection site:  • You may ice the injection site for local discomfort.  Ice should be covered with a cloth - Never place directly on skin.  • Apply ice for 15 minutes on then off for 1 hour.  Repeat as needed.  • Take band aid off any time.  • Do not submerge the area in water for 24 hours - showering is OK.  • Avoid strenuous activity for the first two days after injection.  Diabetic patients:  • Your blood sugar can be elevated for about a week due to the steroid used in the injection, so check your blood sugar regularly.  • Call your primary care physician if it goes above 300.  Common side effects of injections:  • Bruising and minor swelling at site  • Increased pain  • Numbness for the first 6-8 hours after injection - this should subside or return to baseline  • Sometimes the medication can cause skin pigment lightening or changes.      CPP Study  (Cortical Pathophysiology of Chronic Pain)      PI: Broady Lafoy Geha, MD      Research Subjects Needed!  Are you a fluent English speaker over 18 years old?  Have you been diagnosed with Knee Osteoarthritis?  If you answered Yes  to these questions, you may be eligible to participate in a pain research study!  Research Subjects Needed!Research Subjects Needed!    What is involved if I qualify to participate?  1-4 labs appointments  1-2 free 45-minute MRI brain scans  You will receive a FREE copy of the MRI after completion  In addition, you will receive up to $300, and parking or travel reimbursement for your participation    To learn more, please contact the [facility] using the information below:  CPP Study Chronic Pain Research:   [phone]  [email or web address]                      STRIDES-1 Study  Clinical Research Study for Osteoarthritis of the Knee    Are you or a loved one living with Osteoarthritis of the Knee, and seeking care options? You may be eligible to participate in a clinical research study, evaluating the safety and effectiveness of a new investigational medication. Qualified study participants will receive study related care at no cost.    Participation Requirements  You or a Loved One May Qualify If You:  • Are between 40-80 years old     • Have been diagnosed with Osteoarthritis of the knee  • Have been   experiencing pain due to OA of the knee for at least 26 weeks   • Are willing to complete an electronic diary on a daily basis in the evening for the duration of the 28-week study   • Have not had a partial or complete joint replacement surgery in either knee  • Are not using a structured knee brace daily for support.     About the Clinical Research Study  This clinical research study is testing the safety and effectiveness of a new investigational medication, in comparison to a placebo, administered as a single injection in the Knee. Qualified individuals will participate in a 28-week study period with approximately 7 in-person visits and 3 telephone visits.    Approximately half the subjects in the study will receive placebo, meaning that you have a 50:50 chance of receiving placebo. The study  treatment that you receive for the study will be selected at random, similar to flipping a coin.  To see if you or a loved one qualify:    Email [email or web address] or call [phone]

## 2020-01-09 LAB — UNMAPPED LAB RESULTS
Hematocrit (HT): 43 % (ref 40–52)
Hemoglobin (HGB) (HT): 14.5 g/dL (ref 13.0–17.5)
MCHC (HT): 33.5 g/dL (ref 32.0–36.0)
MCV (HT): 86.3 fL (ref 81.0–99.0)
Mean Corpuscular Hemoglobin (MCH) (HT): 28.9 pg (ref 26.0–34.0)
Platelets (HT): 227 10 3/uL (ref 140–400)
RBC (HT): 5.02 10 6/uL (ref 4.20–5.90)
RDW (HT): 12.9 % (ref 11.5–15.0)
WBC (HT): 6.3 10 3/uL (ref 4.0–10.8)

## 2020-05-14 LAB — UNMAPPED LAB RESULTS
Hematocrit (HT): 40 % (ref 40–52)
Hemoglobin (HGB) (HT): 13.6 g/dL (ref 13.0–17.5)
MCHC (HT): 34.3 g/dL (ref 32.0–36.0)
MCV (HT): 86.3 fL (ref 81.0–99.0)
Mean Corpuscular Hemoglobin (MCH) (HT): 29.6 pg (ref 26.0–34.0)
Platelets (HT): 215 10 3/uL (ref 140–400)
RBC (HT): 4.6 10 6/uL (ref 4.20–5.90)
RDW (HT): 12.1 % (ref 11.5–15.0)
WBC (HT): 5.7 10 3/uL (ref 4.0–10.8)

## 2020-05-25 ENCOUNTER — Encounter: Payer: Self-pay | Admitting: Physical Medicine and Rehabilitation

## 2020-05-25 ENCOUNTER — Ambulatory Visit
Payer: Medicare (Managed Care) | Attending: Physical Medicine and Rehabilitation | Admitting: Physical Medicine and Rehabilitation

## 2020-05-25 VITALS — BP 137/65 | HR 85 | Temp 97.3°F

## 2020-05-25 DIAGNOSIS — M170 Bilateral primary osteoarthritis of knee: Secondary | ICD-10-CM | POA: Insufficient documentation

## 2020-05-25 MED ORDER — METHYLPREDNISOLONE ACETATE 40 MG/ML IJ SUSP *I*
40.0000 mg | Freq: Once | INTRAMUSCULAR | Status: AC | PRN
Start: 2020-05-25 — End: 2020-05-25
  Administered 2020-05-25: 40 mg via INTRA_ARTICULAR

## 2020-05-25 MED ORDER — LIDOCAINE HCL 1 % IJ SOLN *I*
1.0000 mL | Freq: Once | INTRAMUSCULAR | Status: AC | PRN
Start: 2020-05-25 — End: 2020-05-25
  Administered 2020-05-25: 1 mL via INTRA_ARTICULAR

## 2020-05-25 MED ORDER — ETHYL CHLORIDE EX AERO (MULTIPLE PATIENTS) *I*
1.0000 | INHALATION_SPRAY | Freq: Once | CUTANEOUS | Status: AC | PRN
Start: 2020-05-25 — End: 2020-05-25
  Administered 2020-05-25: 1 via TOPICAL

## 2020-05-25 NOTE — Patient Instructions (Signed)
PM&R Post Injection/Procedure Instructions    You received a steroid/cortisone injection today.  This injection has numbing medicine (Lidocaine) and steroid medication.  The numbing medicine may start to work right away and give you immediate pain relief.  However, this will wear off in several hours.  It can take 5-7 days and sometimes up to two weeks for the steroid medication to take effect.  Therefore, please give the injection at least a week to start to notice a benefit.     Notify the office [phone] if you experience any of the following after today’s procedure:  • Fever of 100 degrees Fahrenheit or 38 degrees Celsius or greater  • Excessive swelling or redness at the injection site  • Excessive pain or numbness that lasts longer than 72 hours after your procedure.  • Seek urgent medical care if staff is unavailable.   Care of the injection site:  • You may ice the injection site for local discomfort.  Ice should be covered with a cloth - Never place directly on skin.  • Apply ice for 15 minutes on then off for 1 hour.  Repeat as needed.  • Take band aid off any time.  • Do not submerge the area in water for 24 hours - showering is OK.  • Avoid strenuous activity for the first two days after injection.  Diabetic patients:  • Your blood sugar can be elevated for about a week due to the steroid used in the injection, so check your blood sugar regularly.  • Call your primary care physician if it goes above 300.  Common side effects of injections:  • Bruising and minor swelling at site  • Increased pain  • Numbness for the first 6-8 hours after injection - this should subside or return to baseline  • Sometimes the medication can cause skin pigment lightening or changes.

## 2020-05-25 NOTE — Procedures (Signed)
Large Joint Aspiration/Injection Procedure: R knee intra - articular    Date/Time: 05/25/2020  1:20 PM EDT  Consent given by: patient (Risks, benefits, and alternatives to injection were discussed, including the possibility of pain, bleeding, infection, systemic reaction and lack of clinical improvement. They verbalized understanding.)  Site marked: site marked  Timeout: Immediately prior to procedure a time out was called to verify the correct patient, procedure, equipment, support staff and site/side marked as required     Procedure Details    Location: knee - R knee intra - articular  Preparation: The site was prepped using the usual aseptic technique.  Anesthetics administered: 1 spray ethyl chloride; 1 mL lidocaine hcl 1 %; 1 mL lidocaine hcl 1 %  Intra-Articular Steroids administered: 40 mg methylPREDNISolone acetate 40 MG/ML  Dressing:  A dry, sterile dressing was applied.  Patient tolerance: patient tolerated the procedure well with no immediate complications      A1c around 8.3- does not usually check sugars- aware can go up after injections today

## 2020-05-25 NOTE — Procedures (Signed)
Large Joint Aspiration/Injection Procedure: L knee intra - articular    Date/Time: 05/25/2020  1:20 PM EDT  Consent given by: patient (Risks, benefits, and alternatives to injection were discussed, including the possibility of pain, bleeding, infection, systemic reaction and lack of clinical improvement. They verbalized understanding.)  Site marked: site marked  Timeout: Immediately prior to procedure a time out was called to verify the correct patient, procedure, equipment, support staff and site/side marked as required     Procedure Details    Location: knee - L knee intra - articular  Preparation: The site was prepped using the usual aseptic technique.  Anesthetics administered: 1 spray ethyl chloride; 1 mL lidocaine hcl 1 %; 1 mL lidocaine hcl 1 %  Intra-Articular Steroids administered: 40 mg methylPREDNISolone acetate 40 MG/ML  Dressing:  A dry, sterile dressing was applied.  Patient tolerance: patient tolerated the procedure well with no immediate complications      A1c around 8.3- does not usually check sugars- aware can go up after injections today

## 2020-07-13 ENCOUNTER — Encounter: Payer: Self-pay | Admitting: Physical Medicine and Rehabilitation

## 2021-02-16 LAB — UNMAPPED LAB RESULTS
ABO RH Blood Type (HT): A POS
Antibody Screen (HT): NEGATIVE
Hemoglobin (HGB) (HT): 12.8 g/dL — ABNORMAL LOW (ref 13.0–17.5)

## 2021-02-26 LAB — UNMAPPED LAB RESULTS
Hematocrit (HT): 36 % — ABNORMAL LOW (ref 40–52)
Hemoglobin (HGB) (HT): 11.6 g/dL — ABNORMAL LOW (ref 13.0–17.5)
MCHC (HT): 32.2 g/dL (ref 32.0–36.0)
MCV (HT): 88 fL (ref 81–99)
Mean Corpuscular Hemoglobin (MCH) (HT): 28.4 pg (ref 26.0–34.0)
Platelets (HT): 211 10 3/uL (ref 140–400)
RBC (HT): 4.08 10 6/uL — ABNORMAL LOW (ref 4.60–6.20)
RDW (HT): 13.2 % (ref 11.5–15.0)
WBC (HT): 13.3 10 3/uL — ABNORMAL HIGH (ref 4.0–10.0)

## 2021-05-18 LAB — UNMAPPED LAB RESULTS
Basophil # (HT): 0 10 3/uL (ref 0.0–0.2)
Basophil % (HT): 1 % (ref 0–2)
Eosinophil # (HT): 0.2 10 3/uL (ref 0.0–0.5)
Eosinophil % (HT): 4 % (ref 0–7)
Hematocrit (HT): 40 % (ref 40–52)
Hemoglobin (HGB) (HT): 13.2 g/dL (ref 13.0–17.5)
Lymphocyte # (HT): 1.1 10 3/uL (ref 0.9–3.8)
Lymphocyte % (HT): 17 % (ref 17–44)
MCHC (HT): 33.2 g/dL (ref 32.0–36.0)
MCV (HT): 83.4 fL (ref 81.0–99.0)
Mean Corpuscular Hemoglobin (MCH) (HT): 27.7 pg (ref 26.0–34.0)
Monocyte # (HT): 0.4 10 3/uL (ref 0.2–1.0)
Monocyte % (HT): 6 % (ref 4–12)
Neutrophil # (HT): 4.5 10 3/uL (ref 1.5–7.7)
Platelets (HT): 264 10 3/uL (ref 140–400)
RBC (HT): 4.77 10 6/uL (ref 4.20–5.90)
RDW (HT): 12.8 % (ref 11.5–15.0)
Seg Neut % (HT): 72 % (ref 40–75)
WBC (HT): 6.2 10 3/uL (ref 4.0–10.8)

## 2022-01-30 LAB — UNMAPPED LAB RESULTS
Basophil # (HT): 0 10 3/uL (ref 0.0–0.2)
Basophil % (HT): 0 % (ref 0–2)
Eosinophil # (HT): 0 10 3/uL (ref 0.0–0.5)
Eosinophil % (HT): 0 % (ref 0–7)
Hematocrit (HT): 44 % (ref 40–52)
Hemoglobin (HGB) (HT): 14.6 g/dL (ref 13.0–17.5)
Lymphocyte # (HT): 0.9 10 3/uL (ref 0.9–3.8)
Lymphocyte % (HT): 12 % — ABNORMAL LOW (ref 17–44)
MCHC (HT): 33 g/dL (ref 32.0–36.0)
MCV (HT): 87 fL (ref 81.0–99.0)
Mean Corpuscular Hemoglobin (MCH) (HT): 28.7 pg (ref 26.0–34.0)
Monocyte # (HT): 0.7 10 3/uL (ref 0.2–1.0)
Monocyte % (HT): 9 % (ref 4–12)
Neutrophil # (HT): 6.1 10 3/uL (ref 1.5–7.7)
Platelets (HT): 226 10 3/uL (ref 140–400)
RBC (HT): 5.08 10 6/uL (ref 4.20–5.90)
RDW (HT): 12.4 % (ref 11.5–15.0)
Seg Neut % (HT): 79 % — ABNORMAL HIGH (ref 40–75)
WBC (HT): 7.7 10 3/uL (ref 4.0–10.8)

## 2022-05-24 LAB — UNMAPPED LAB RESULTS
Basophil # (HT): 0 10 3/uL (ref 0.0–0.2)
Basophil % (HT): 1 % (ref 0–2)
Eosinophil # (HT): 0.2 10 3/uL (ref 0.0–0.5)
Eosinophil % (HT): 4 % (ref 0–7)
Hematocrit (HT): 42 % (ref 40–52)
Hemoglobin (HGB) (HT): 13.9 g/dL (ref 13.0–17.5)
Lymphocyte # (HT): 1.2 10 3/uL (ref 0.9–3.8)
Lymphocyte % (HT): 23 % (ref 17–44)
MCHC (HT): 33.5 g/dL (ref 32.0–36.0)
MCV (HT): 87.6 fL (ref 81.0–99.0)
Mean Corpuscular Hemoglobin (MCH) (HT): 29.3 pg (ref 26.0–34.0)
Monocyte # (HT): 0.4 10 3/uL (ref 0.2–1.0)
Monocyte % (HT): 8 % (ref 4–12)
Neutrophil # (HT): 3.4 10 3/uL (ref 1.5–7.7)
Platelets (HT): 222 10 3/uL (ref 140–400)
RBC (HT): 4.74 10 6/uL (ref 4.20–5.90)
RDW (HT): 12.3 % (ref 11.5–15.0)
Seg Neut % (HT): 64 % (ref 40–75)
WBC (HT): 5.3 10 3/uL (ref 4.0–10.8)

## 2023-01-17 LAB — UNMAPPED LAB RESULTS
Basophil # (HT): 0 10 3/uL (ref 0.0–0.2)
Basophil % (HT): 1 % (ref 0–2)
Eosinophil # (HT): 0.2 10 3/uL (ref 0.0–0.5)
Eosinophil % (HT): 5 % (ref 0–7)
Hematocrit (HT): 41 % (ref 40–52)
Hemoglobin (HGB) (HT): 13.4 g/dL (ref 13.0–17.5)
Lymphocyte # (HT): 1 10 3/uL (ref 0.9–3.8)
Lymphocyte % (HT): 20 % (ref 17–44)
MCHC (HT): 33.1 g/dL (ref 32.0–36.0)
MCV (HT): 88 fL (ref 81.0–99.0)
Mean Corpuscular Hemoglobin (MCH) (HT): 29.1 pg (ref 26.0–34.0)
Monocyte # (HT): 0.3 10 3/uL (ref 0.2–1.0)
Monocyte % (HT): 7 % (ref 4–12)
Neutrophil # (HT): 3.3 10 3/uL (ref 1.5–7.7)
Platelets (HT): 227 10 3/uL (ref 150–450)
RBC (HT): 4.6 10 6/uL (ref 4.20–5.90)
RDW (HT): 12.6 % (ref 11.5–15.0)
Seg Neut % (HT): 68 % (ref 40–75)
WBC (HT): 4.9 10 3/uL (ref 4.0–10.8)

## 2023-05-28 LAB — UNMAPPED LAB RESULTS
Basophil # (HT): 0 10 3/uL (ref 0.0–0.2)
Basophil % (HT): 1 % (ref 0–2)
Eosinophil # (HT): 0.2 10 3/uL (ref 0.0–0.5)
Eosinophil % (HT): 4 % (ref 0–7)
Hematocrit (HT): 42 % (ref 40–52)
Hemoglobin (HGB) (HT): 14.2 g/dL (ref 13.0–17.5)
Lymphocyte # (HT): 1.2 10 3/uL (ref 0.9–3.8)
Lymphocyte % (HT): 20 % (ref 17–44)
MCHC (HT): 33.8 g/dL (ref 32.0–36.0)
MCV (HT): 87.3 fL (ref 81.0–99.0)
Mean Corpuscular Hemoglobin (MCH) (HT): 29.5 pg (ref 26.0–34.0)
Monocyte # (HT): 0.5 10 3/uL (ref 0.2–1.0)
Monocyte % (HT): 8 % (ref 4–12)
Neutrophil # (HT): 3.9 10 3/uL (ref 1.5–7.7)
Platelets (HT): 243 10 3/uL (ref 150–450)
RBC (HT): 4.81 10 6/uL (ref 4.20–5.90)
RDW (HT): 12.5 % (ref 11.5–15.0)
Seg Neut % (HT): 67 % (ref 40–75)
WBC (HT): 5.9 10 3/uL (ref 4.0–10.8)

## 2023-08-14 ENCOUNTER — Inpatient Hospital Stay: Admit: 2023-08-14 | Discharge: 2023-08-14 | Disposition: A | Discharge status: Home / Self Care | Payer: Self-pay

## 2023-08-14 IMAGING — MR MRI LUMBAR SPINE WITHOUT CONTRAST
4 of 9 series · 10 of 48 positions shown · IV contrast (gadolinium)
Comparison: None

________________________________________________________________________________________________ 
MRI LUMBAR SPINE WITHOUT CONTRAST, 08/14/2023 [DATE]: 
CLINICAL INDICATION: Vertebrogenic low back pain , low back pain for 3 months. 
Worsening. Pain radiates down the right leg.
TECHNIQUE: Multiplanar, multiecho position MR images of the lumbar spine were 
performed without intravenous gadolinium enhancement. Patient was scanned on a 
3T magnet

[Series 101: survey · axial · 10.0mm · 1.39mm/px · z∈[-15,+199]mm · 2 of 9 slices shown]
[im 1/9]
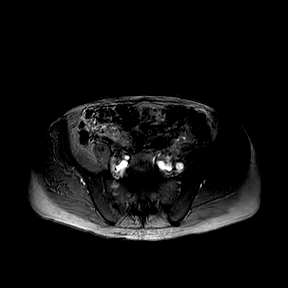
[im 9/9]
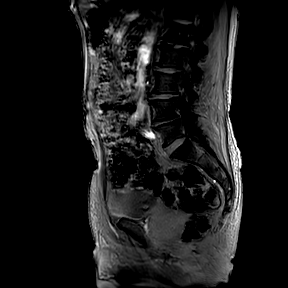

[Series 201: t2w_cor-surv · coronal · 6.0mm · 0.50mm/px · 1 of 5 slices shown]
[im 1/5]
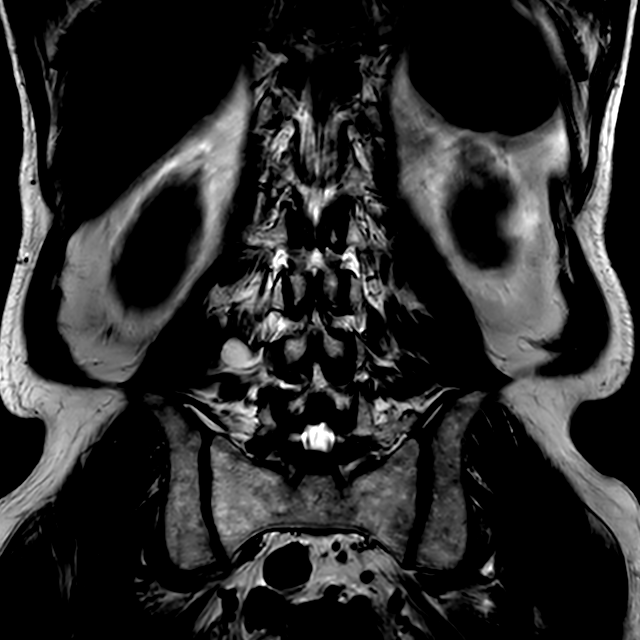

[Series 301: t1w_tse sag · sagittal · 4.0mm · 0.25mm/px · 2 of 17 slices shown]
[im 1/17]
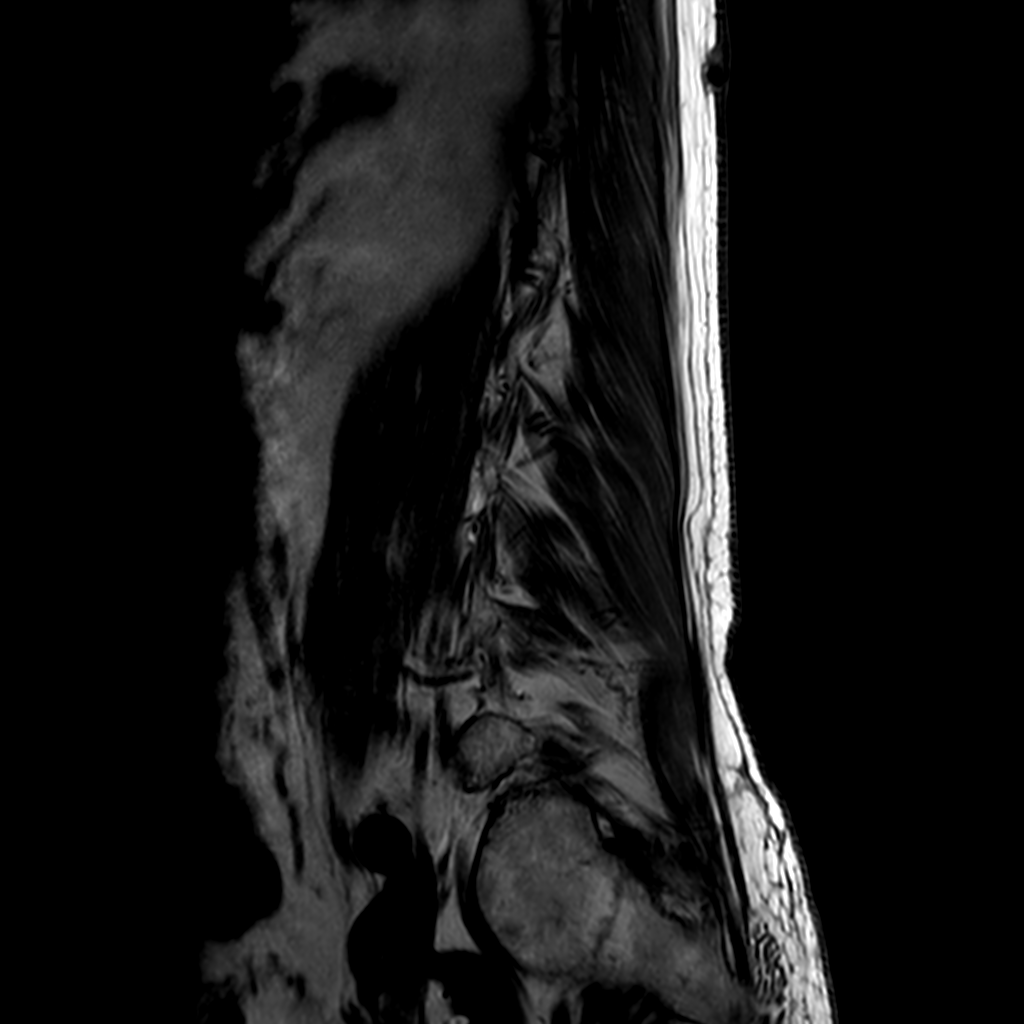
[im 17/17]
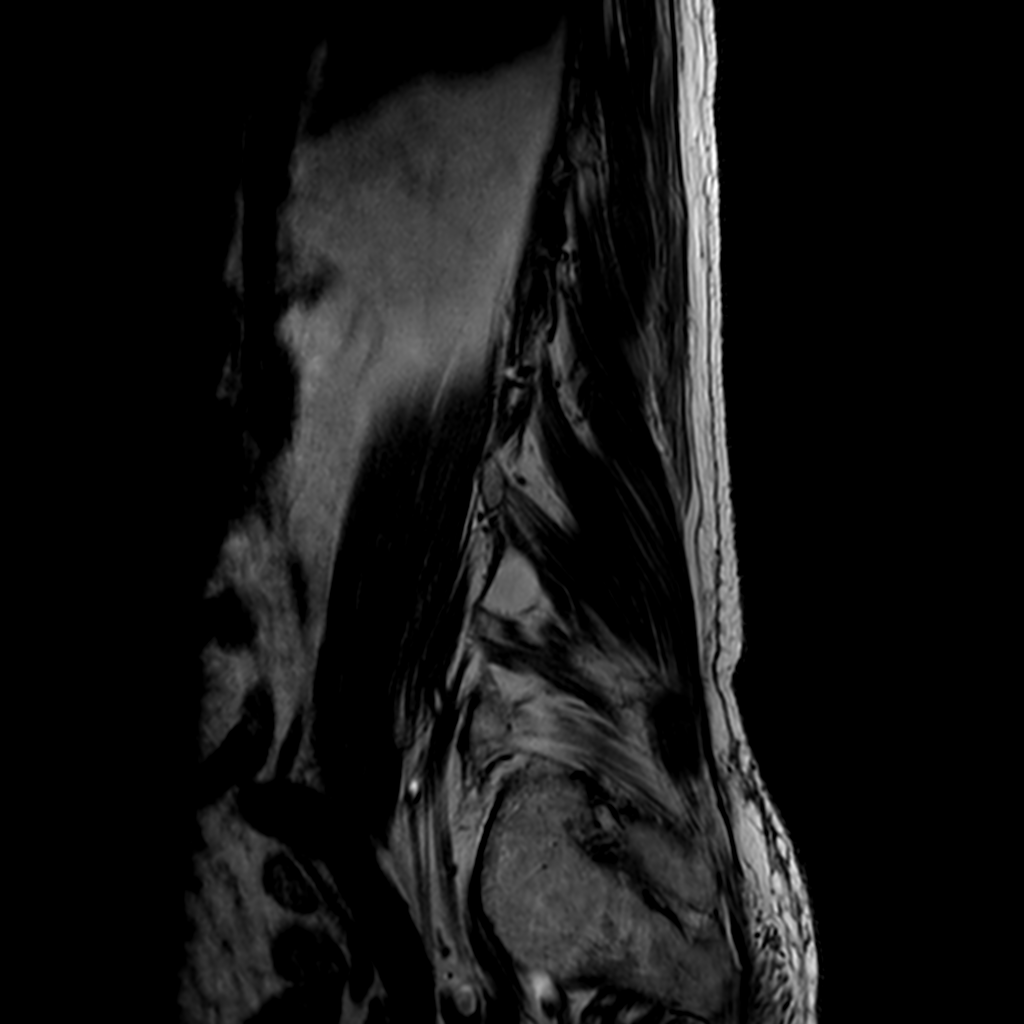

[Series 801: T1 · axial · 5.0mm · 0.35mm/px · z∈[-9,+188]mm · 5 of 34 slices shown]
[im 1/34]
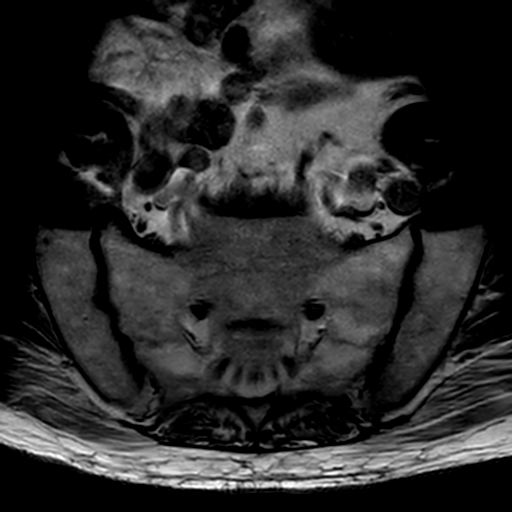
[im 9/34]
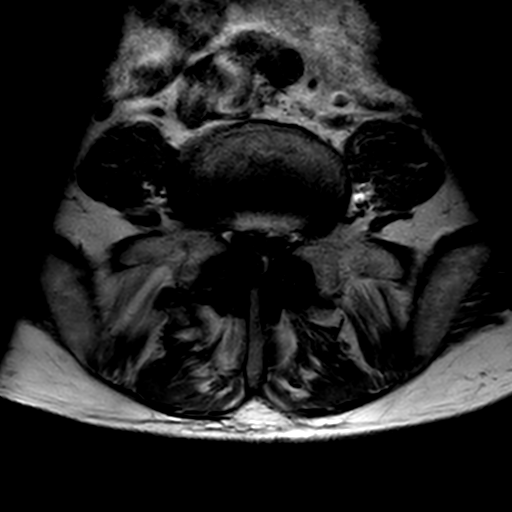
[im 17/34]
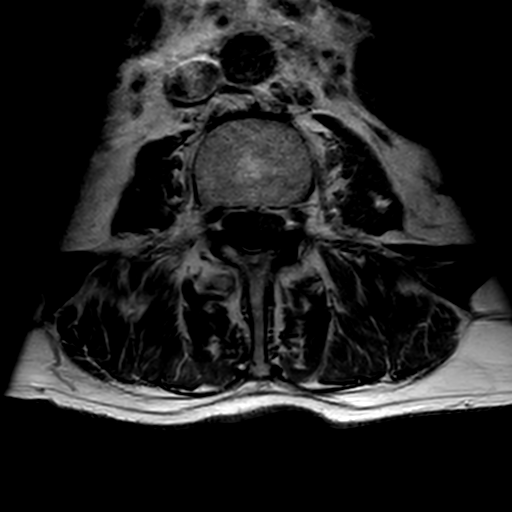
[im 25/34]
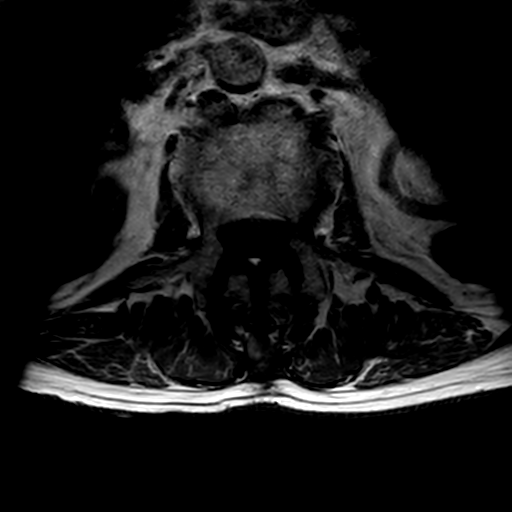
[im 34/34]
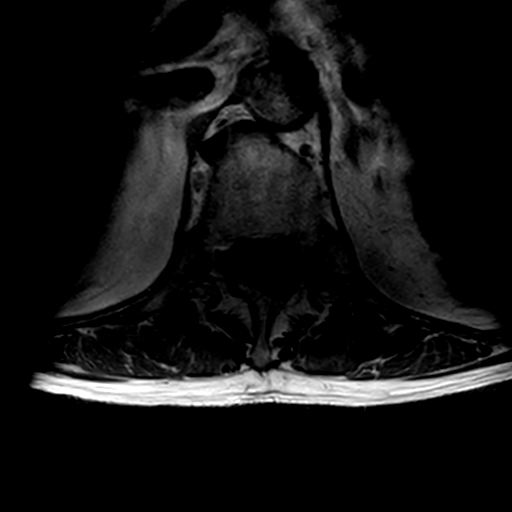

[10 of 48 positions shown; findings below may reference images not displayed]

FINDINGS: -------------------------------------------------------------------------------- 
------ 
GENERAL: 
Nomenclature is based on 5 lumbar type vertebral bodies.     
ALIGNMENT: Minimal dextroconvex lumbar scoliosis. Mild retrolisthesis L1 on L2 
and L2 on L3 and L3 on L4. No pars defect. 
VERTEBRAL BODY HEIGHT: Minimal anterior wedging T12. No marrow edema.  
MARROW SIGNAL: No focal suspect signal abnormality. L2 hemangioma. 
CORD SIGNAL: Normal distal spinal cord and cauda equina. Conus medullaris 
terminates at L1. 
ADDITIONAL FINDINGS: Susceptibility artifact from left hip arthroplasty. Colonic 
diverticulosis. Cystic structure involving the left kidney is likely to reflect 
a renal cyst. Bilateral extrarenal pelves. 
Modic I-II: L4-L5 
Ligamentum Flavum > 2.5 mm: All levels. 
-------------------------------------------------------------------------------- 
------ 
SEGMENTAL: 
T11-T12: Loss of disc height and signal with Schmorls nodes. Borderline canal 
stenosis.  
T12-L1: Loss of disc signal. Otherwise normal. 
L1-L2: Loss of disc height and signal with Schmorls nodes. Left paracentral 
disc extrusion with disc material extending inferiorly effaces the left lateral 
recess and could affect the traversing left L2 nerve root. Mild diffuse annular 
bulge otherwise present with mild canal stenosis. Foramina patent with normal 
facets. 
L2-L3: Loss of disc height and signal. Schmorls nodes. Mild diffuse annular 
bulge. Mild canal stenosis. Moderate narrowing left lateral recess with mild 
right lateral recess narrowing. Normal facets. Patent left foramen. Right 
foramen is mildly narrowed. 
L3-L4: Loss of disc height and signal with Schmorls nodes. Mild annular bulge 
with mild to moderate canal stenosis. Moderate left lateral recess narrowing 
with mild right lateral recess narrowing. Mild facet arthropathy. Left foramen 
patent. Mild right foraminal narrowing. 
L4-L5: Loss of disc height and signal with Schmorls node. Severe canal stenosis 
with cauda equina compression. Annular bulge, ligament flavum hypertrophy, and 
facet arthropathy with small bilateral facet joint effusions. Severe lateral 
recess narrowing bilaterally. Mild to moderate right and mild left foraminal 
narrowing. There is a facet synovial cyst measuring 4 mm that emanates from the 
left facet joint and projects near the posterior aspect of the left neural 
foramen, image 21 series 601. 
L5-S1: Loss of disc signal. Mild annular bulge with mild canal stenosis. 
Moderate lateral recess narrowing bilaterally. Facet arthropathy. 2 mm 
right-sided facet synovial cyst with minimal mass effect upon the thecal sac, 
image 3 series 701. Foramina patent. Facet arthropathy. 
-------------------------------------------------------------------------------- 
------
IMPRESSION: Multilevel lumbar spondylosis. 
Most significant canal stenosis is at L4-L5, severe, with cauda equina 
compression and severe lateral recess narrowing bilaterally. 
L1-L2, left paracentral disc extrusion could affect traversing left L2 nerve 
root. 
Other multilevel significant lateral recess and foraminal stenoses. 
Exam was reviewed and read in conjunction with Dr. Youn.

## 2024-01-14 ENCOUNTER — Ambulatory Visit
Admission: RE | Admit: 2024-01-14 | Discharge: 2024-01-14 | Disposition: A | Discharge status: Home / Self Care | Payer: Medicare (Managed Care) | Source: Ambulatory Visit

## 2024-01-14 ENCOUNTER — Ambulatory Visit: Payer: Medicare (Managed Care) | Attending: Orthopedic Surgery | Admitting: Orthopedic Surgery

## 2024-01-14 ENCOUNTER — Encounter: Payer: Self-pay | Admitting: Orthopedic Surgery

## 2024-01-14 ENCOUNTER — Other Ambulatory Visit: Payer: Self-pay

## 2024-01-14 VITALS — BP 151/78 | HR 77 | Ht 68.5 in | Wt 148.0 lb

## 2024-01-14 DIAGNOSIS — M48061 Spinal stenosis, lumbar region without neurogenic claudication: Secondary | ICD-10-CM

## 2024-01-14 DIAGNOSIS — M4316 Spondylolisthesis, lumbar region: Secondary | ICD-10-CM

## 2024-01-14 DIAGNOSIS — I700 Atherosclerosis of aorta: Secondary | ICD-10-CM

## 2024-01-14 DIAGNOSIS — M5116 Intervertebral disc disorders with radiculopathy, lumbar region: Secondary | ICD-10-CM

## 2024-01-14 DIAGNOSIS — M5416 Radiculopathy, lumbar region: Secondary | ICD-10-CM

## 2024-01-14 NOTE — Progress Notes (Signed)
 Chief Complaint   Patient presents with    Spine - New Patient Visit       HPI: Adam Duran is 83 y.o. male referred by Dr. Socorro Dunks, MD for evaluation of low back. Symptoms have been present for 7 months .    He reports no specific precipitating event.     He reports constant  aching and occasionally sharp type pain in his low back with referred pain into right lower extremity. He describes referred pain into his right buttocks that refers down his leg laterally to his foot.     Since onset he feels symptoms have remained the same.     Alleviating Factors: rest and change of position     Exacerbating Factors: increased activity     He has tried Tylenol    Conservative treatment: physical therapy and spinal injection(s)       Pain    01/14/24 1348   PainSc:   7   PainLoc: Back         ROS,  Family, Social and Medical History obtained from from intake form.    Current Outpatient Medications   Medication    METFORMIN HCL PO    atorvastatin (LIPITOR) 10 MG tablet    lisinopril (PRINIVIL,ZESTRIL) 40 MG tablet    verapamil (VERELAN) 360 MG 24 hr capsule    hydrochlorothiazide (MICROZIDE) 12.5 MG capsule     No current facility-administered medications for this visit.       Allergies[1]    Vitals:    01/14/24 1348   BP: 151/78   Pulse: 77   Weight: 67.1 kg (148 lb)   Height: 1.74 m (5' 8.5")          Physical examination:    He  is a healthy and well-appearing 83 y.o. male.     Ambulation: slow and shuffling gait without assistance.    Range of motion of lumbar spine: limited flexion and limited extension.    Straight leg raise: negative     Palpation: Posterior elements of the lumbar spine are non-tender.         Neurological Examination                          MOTOR (0-5) RIGHT LEFT   REFLEXES RIGHT LEFT   L2 (Hip flexion) 5 5   Patella (L4) 2+ 2+   L3 (Knee extension) 5 5   Achilles (S1) 2+ 2+   L4 (Ankle dorsiflexion) 5 5        L5 (Extensor hallucis longus) 5 5        S1 (Ankle plantarflexion/eversion) 5 5                                                                        Sensation is intact to light touch from L3-S1 bilaterally.    Full painless range of motion of the knees and hips bilaterally.   Palpable DP and PT pulses bilaterally.    Radiographic images: Diffuse degenerative changes of the lumbar spine with a subtle listhesis at L4-L5.        Lumbar MRI 07/2023:          Assessment and  Plan: Adam Duran is a 83 y.o. male with   1. Spinal stenosis of lumbar region with radiculopathy  * Spine lumbar standard AP and Lateral views        I reviewed his films and findings with him and his wife today.  He has symptoms of right L5 radiculopathy.  His MRI correlates with this as there is marked spinal stenosis at L4-L5.    He is interested in pursuing a surgical opinion.  I am recommending a consultation with Dr. Avi Lemma to further discuss decompressive surgery.    I have answered all of his questions to his satisfaction, he is agreeable to the treatment plan.         [1] No Known Allergies (drug, envir, food or latex)

## 2024-01-15 ENCOUNTER — Other Ambulatory Visit: Payer: Self-pay

## 2024-01-29 ENCOUNTER — Ambulatory Visit: Payer: Medicare (Managed Care) | Admitting: Orthopedic Surgery

## 8631-03-15 DEATH — deceased
